# Patient Record
Sex: Female | Born: 1940
Health system: Southern US, Community
[De-identification: ages and names within clinical notes are randomized; demographics above are authoritative.]

## PROBLEM LIST (undated history)

## (undated) DIAGNOSIS — Z4689 Encounter for fitting and adjustment of other specified devices: Secondary | ICD-10-CM

## (undated) DIAGNOSIS — Z87898 Personal history of other specified conditions: Secondary | ICD-10-CM

## (undated) HISTORY — PX: APPENDECTOMY: SHX54

---

## 2011-07-23 ENCOUNTER — Other Ambulatory Visit: Payer: Self-pay | Admitting: Obstetrics and Gynecology

## 2011-09-11 ENCOUNTER — Encounter (HOSPITAL_COMMUNITY): Payer: Self-pay | Admitting: Pharmacy Technician

## 2011-09-17 ENCOUNTER — Other Ambulatory Visit (HOSPITAL_COMMUNITY): Payer: Self-pay

## 2011-09-18 ENCOUNTER — Inpatient Hospital Stay (HOSPITAL_COMMUNITY): Admission: RE | Admit: 2011-09-18 | Payer: Self-pay | Source: Ambulatory Visit

## 2011-09-24 ENCOUNTER — Inpatient Hospital Stay (HOSPITAL_COMMUNITY)
Admission: RE | Admit: 2011-09-24 | Payer: Medicare Other | Source: Ambulatory Visit | Admitting: Obstetrics and Gynecology

## 2011-09-24 ENCOUNTER — Encounter (HOSPITAL_COMMUNITY): Admission: RE | Payer: Self-pay | Source: Ambulatory Visit

## 2011-09-24 SURGERY — HYSTERECTOMY, ABDOMINAL
Anesthesia: Choice

## 2016-11-20 DIAGNOSIS — E039 Hypothyroidism, unspecified: Secondary | ICD-10-CM | POA: Diagnosis not present

## 2016-11-20 DIAGNOSIS — E559 Vitamin D deficiency, unspecified: Secondary | ICD-10-CM | POA: Diagnosis not present

## 2016-11-20 DIAGNOSIS — R799 Abnormal finding of blood chemistry, unspecified: Secondary | ICD-10-CM | POA: Diagnosis not present

## 2016-11-20 DIAGNOSIS — B379 Candidiasis, unspecified: Secondary | ICD-10-CM | POA: Diagnosis not present

## 2016-11-20 DIAGNOSIS — N951 Menopausal and female climacteric states: Secondary | ICD-10-CM | POA: Diagnosis not present

## 2016-12-05 DIAGNOSIS — Z6824 Body mass index (BMI) 24.0-24.9, adult: Secondary | ICD-10-CM | POA: Diagnosis not present

## 2016-12-05 DIAGNOSIS — N814 Uterovaginal prolapse, unspecified: Secondary | ICD-10-CM | POA: Diagnosis not present

## 2017-02-17 DIAGNOSIS — R208 Other disturbances of skin sensation: Secondary | ICD-10-CM | POA: Diagnosis not present

## 2017-02-17 DIAGNOSIS — Z85828 Personal history of other malignant neoplasm of skin: Secondary | ICD-10-CM | POA: Diagnosis not present

## 2017-02-17 DIAGNOSIS — X32XXXA Exposure to sunlight, initial encounter: Secondary | ICD-10-CM | POA: Diagnosis not present

## 2017-02-17 DIAGNOSIS — L570 Actinic keratosis: Secondary | ICD-10-CM | POA: Diagnosis not present

## 2017-02-17 DIAGNOSIS — L538 Other specified erythematous conditions: Secondary | ICD-10-CM | POA: Diagnosis not present

## 2017-02-17 DIAGNOSIS — L820 Inflamed seborrheic keratosis: Secondary | ICD-10-CM | POA: Diagnosis not present

## 2017-02-17 DIAGNOSIS — L821 Other seborrheic keratosis: Secondary | ICD-10-CM | POA: Diagnosis not present

## 2017-02-17 DIAGNOSIS — Z08 Encounter for follow-up examination after completed treatment for malignant neoplasm: Secondary | ICD-10-CM | POA: Diagnosis not present

## 2017-02-17 DIAGNOSIS — D225 Melanocytic nevi of trunk: Secondary | ICD-10-CM | POA: Diagnosis not present

## 2017-03-05 DIAGNOSIS — N814 Uterovaginal prolapse, unspecified: Secondary | ICD-10-CM | POA: Diagnosis not present

## 2017-05-09 DIAGNOSIS — H5203 Hypermetropia, bilateral: Secondary | ICD-10-CM | POA: Diagnosis not present

## 2017-05-09 DIAGNOSIS — H52223 Regular astigmatism, bilateral: Secondary | ICD-10-CM | POA: Diagnosis not present

## 2017-05-09 DIAGNOSIS — H2513 Age-related nuclear cataract, bilateral: Secondary | ICD-10-CM | POA: Diagnosis not present

## 2017-05-09 DIAGNOSIS — H524 Presbyopia: Secondary | ICD-10-CM | POA: Diagnosis not present

## 2017-07-03 DIAGNOSIS — N814 Uterovaginal prolapse, unspecified: Secondary | ICD-10-CM | POA: Diagnosis not present

## 2017-07-30 DIAGNOSIS — N814 Uterovaginal prolapse, unspecified: Secondary | ICD-10-CM | POA: Diagnosis not present

## 2017-08-29 DIAGNOSIS — R799 Abnormal finding of blood chemistry, unspecified: Secondary | ICD-10-CM | POA: Diagnosis not present

## 2017-08-29 DIAGNOSIS — E039 Hypothyroidism, unspecified: Secondary | ICD-10-CM | POA: Diagnosis not present

## 2017-08-29 DIAGNOSIS — E559 Vitamin D deficiency, unspecified: Secondary | ICD-10-CM | POA: Diagnosis not present

## 2017-12-03 DIAGNOSIS — N814 Uterovaginal prolapse, unspecified: Secondary | ICD-10-CM | POA: Diagnosis not present

## 2018-02-17 DIAGNOSIS — Z85828 Personal history of other malignant neoplasm of skin: Secondary | ICD-10-CM | POA: Diagnosis not present

## 2018-02-17 DIAGNOSIS — L820 Inflamed seborrheic keratosis: Secondary | ICD-10-CM | POA: Diagnosis not present

## 2018-02-17 DIAGNOSIS — R238 Other skin changes: Secondary | ICD-10-CM | POA: Diagnosis not present

## 2018-02-17 DIAGNOSIS — L821 Other seborrheic keratosis: Secondary | ICD-10-CM | POA: Diagnosis not present

## 2018-02-17 DIAGNOSIS — D239 Other benign neoplasm of skin, unspecified: Secondary | ICD-10-CM | POA: Diagnosis not present

## 2018-02-17 DIAGNOSIS — Z08 Encounter for follow-up examination after completed treatment for malignant neoplasm: Secondary | ICD-10-CM | POA: Diagnosis not present

## 2018-02-17 DIAGNOSIS — Z872 Personal history of diseases of the skin and subcutaneous tissue: Secondary | ICD-10-CM | POA: Diagnosis not present

## 2018-05-04 DIAGNOSIS — M25562 Pain in left knee: Secondary | ICD-10-CM | POA: Diagnosis not present

## 2018-05-04 DIAGNOSIS — M1712 Unilateral primary osteoarthritis, left knee: Secondary | ICD-10-CM | POA: Diagnosis not present

## 2018-05-07 DIAGNOSIS — M25562 Pain in left knee: Secondary | ICD-10-CM | POA: Diagnosis not present

## 2018-05-07 DIAGNOSIS — M25462 Effusion, left knee: Secondary | ICD-10-CM | POA: Diagnosis not present

## 2018-05-29 DIAGNOSIS — N814 Uterovaginal prolapse, unspecified: Secondary | ICD-10-CM | POA: Diagnosis not present

## 2018-05-29 DIAGNOSIS — N393 Stress incontinence (female) (male): Secondary | ICD-10-CM | POA: Diagnosis not present

## 2018-05-29 DIAGNOSIS — R82998 Other abnormal findings in urine: Secondary | ICD-10-CM | POA: Diagnosis not present

## 2018-06-23 DIAGNOSIS — N814 Uterovaginal prolapse, unspecified: Secondary | ICD-10-CM | POA: Diagnosis not present

## 2018-06-23 DIAGNOSIS — N952 Postmenopausal atrophic vaginitis: Secondary | ICD-10-CM | POA: Diagnosis not present

## 2018-06-23 DIAGNOSIS — N765 Ulceration of vagina: Secondary | ICD-10-CM | POA: Diagnosis not present

## 2018-07-21 DIAGNOSIS — N393 Stress incontinence (female) (male): Secondary | ICD-10-CM | POA: Diagnosis not present

## 2018-07-21 DIAGNOSIS — N8111 Cystocele, midline: Secondary | ICD-10-CM | POA: Diagnosis not present

## 2018-07-21 DIAGNOSIS — N814 Uterovaginal prolapse, unspecified: Secondary | ICD-10-CM | POA: Diagnosis not present

## 2018-07-28 DIAGNOSIS — H43313 Vitreous membranes and strands, bilateral: Secondary | ICD-10-CM | POA: Diagnosis not present

## 2018-07-28 DIAGNOSIS — H2513 Age-related nuclear cataract, bilateral: Secondary | ICD-10-CM | POA: Diagnosis not present

## 2018-07-28 DIAGNOSIS — H52223 Regular astigmatism, bilateral: Secondary | ICD-10-CM | POA: Diagnosis not present

## 2018-09-15 DIAGNOSIS — N812 Incomplete uterovaginal prolapse: Secondary | ICD-10-CM | POA: Diagnosis not present

## 2018-09-15 DIAGNOSIS — R82998 Other abnormal findings in urine: Secondary | ICD-10-CM | POA: Diagnosis not present

## 2018-09-15 DIAGNOSIS — N898 Other specified noninflammatory disorders of vagina: Secondary | ICD-10-CM | POA: Diagnosis not present

## 2018-09-15 DIAGNOSIS — Z4689 Encounter for fitting and adjustment of other specified devices: Secondary | ICD-10-CM | POA: Diagnosis not present

## 2018-09-15 DIAGNOSIS — N393 Stress incontinence (female) (male): Secondary | ICD-10-CM | POA: Diagnosis not present

## 2018-09-15 DIAGNOSIS — L929 Granulomatous disorder of the skin and subcutaneous tissue, unspecified: Secondary | ICD-10-CM | POA: Diagnosis not present

## 2018-10-20 DIAGNOSIS — Z4689 Encounter for fitting and adjustment of other specified devices: Secondary | ICD-10-CM | POA: Diagnosis not present

## 2018-10-20 DIAGNOSIS — R82998 Other abnormal findings in urine: Secondary | ICD-10-CM | POA: Diagnosis not present

## 2018-10-20 DIAGNOSIS — N393 Stress incontinence (female) (male): Secondary | ICD-10-CM | POA: Diagnosis not present

## 2018-10-20 DIAGNOSIS — N812 Incomplete uterovaginal prolapse: Secondary | ICD-10-CM | POA: Diagnosis not present

## 2018-10-26 DIAGNOSIS — R82998 Other abnormal findings in urine: Secondary | ICD-10-CM | POA: Diagnosis not present

## 2018-10-26 DIAGNOSIS — R399 Unspecified symptoms and signs involving the genitourinary system: Secondary | ICD-10-CM | POA: Diagnosis not present

## 2018-10-26 DIAGNOSIS — B9620 Unspecified Escherichia coli [E. coli] as the cause of diseases classified elsewhere: Secondary | ICD-10-CM | POA: Diagnosis not present

## 2018-11-10 ENCOUNTER — Ambulatory Visit (INDEPENDENT_AMBULATORY_CARE_PROVIDER_SITE_OTHER): Payer: Medicare Other | Admitting: Sports Medicine

## 2018-11-10 ENCOUNTER — Ambulatory Visit
Admission: RE | Admit: 2018-11-10 | Discharge: 2018-11-10 | Disposition: A | Discharge status: Home / Self Care | Payer: Medicare Other | Source: Ambulatory Visit | Attending: Sports Medicine | Admitting: Sports Medicine

## 2018-11-10 VITALS — BP 120/58 | Temp 98.4°F | Ht 62.5 in | Wt 130.0 lb

## 2018-11-10 DIAGNOSIS — M25562 Pain in left knee: Secondary | ICD-10-CM

## 2018-11-10 DIAGNOSIS — R399 Unspecified symptoms and signs involving the genitourinary system: Secondary | ICD-10-CM | POA: Diagnosis not present

## 2018-11-10 DIAGNOSIS — M1712 Unilateral primary osteoarthritis, left knee: Secondary | ICD-10-CM | POA: Diagnosis not present

## 2018-11-10 NOTE — Progress Notes (Signed)
HPI  CC: Left knee pain  Latoya Reynolds is a 77 year old female presents for left knee pain.  She states the pain started in June of this year.  She states [place] and [place] her [place].  She states she collapsed at that time.  She went to an urgent care in [place], where she had x-rays obtained.  She was told she had a spur in her left knee.  She has not followed up since that time.  She states the pain is gotten progressively better.  She states the pain returned around 1 week ago.  He does not remember an inciting event at that time.  She states the pain is sharp in nature located over the patella.  He states the pain is worse at nighttime.  States is also made worse with prolonged walking.  She has been taking ibuprofen 40 mg twice a day with some relief.  The pain does not radiate any other parts of her body.  She denies any numbness and tingling or leg.  She denies giving out of her leg.  She denies any locking or catching of the knee.  She denies any limp.  She states she did have a fever of 101 F on Saturday this week.  She has no associated symptoms.  She was told she had a urinary tract affection 7 days ago for which she completed a 5-day course of antibiotics.  She is currently asymptomatic otherwise.  Past Injuries: None in the area Past Surgeries: No prior knee surgeries Smoking: Denies Family Hx: Noncontributory  All past medical history, medications, allergies reviewed myself at today's visit.  ROS: Per HPI; in addition no fever, no rash, no additional weakness, no additional numbness, no additional paresthesias, and no additional falls/injury.   Objective: BP (!) 120/58   Temp 98.4 F (36.9 C) (Oral)   Ht 5' 2.5" (1.588 m)   Wt 130 lb (59 kg)   BMI 23.40 kg/m  Gen: Right-Hand Dominant. NAD, well groomed, a/o x3, normal affect.  CV: Well-perfused. Warm.  Resp: Non-labored.  Neuro: Sensation intact throughout. No gross coordination deficits.  Gait:  Nonpathologic posture, unremarkable stride without signs of limp or balance issues.  Left knee exam: Varus alignment of the knees.  No erythema or warmth.  Mild swelling of the knee.  Tenderness palpation on the lateral and medial aspect the patella.  Tenderness palpation over the medial joint line.  Full range of motion knee extension knee flexion.  Strength out of 5 throughout testing.  Negative Lockman, negative posterior drawer, negative valgus stress testing, negative varus stress testing, negative McMurray test.  Assessment and Plan: Left-sided knee pain, likely osteoarthritis in nature.  We discussed treatment options with Latoya Reynolds today's visit.  We will obtain x-rays this time to further evaluate the joint space.  I suspect there will be an element of patellofemoral arthritis, as well as likely medial and lateral involvement as well.  We did offer her steroid injection in the knee today.  She deferred at this time.  We offered her meloxicam as well, which she will stick to Tylenol at this time.  If she returns in 2 weeks and continues to have pain in her knee, I will consider starting meloxicam or doing a steroid injection.  If she begins to develop mechanical symptoms, an MRI may be warranted down the road.  Latoya Rife, MD [facility] Fellow 11/10/2018 1:24 PM  Patient seen and evaluated with the  sports medicine fellow.  I agree with the above plan of care.  Patient's daughter is concerned about septic joint.  I have reassured her that there are no physical exam findings to suggest that.  Patient has a trace effusion on exam and on ultrasound but certainly nothing large enough to aspirate.  I did offer cortisone injection but they would like to wait on that for now.  I was able to review her x-rays and she does have moderate medial compartmental narrowing on the 30 degree flexion view.  If patient would like to reconsider cortisone injection or a brief course of oral  anti-inflammatories, she will let me know.  Otherwise, follow-up as needed.

## 2018-12-01 DIAGNOSIS — N765 Ulceration of vagina: Secondary | ICD-10-CM | POA: Diagnosis not present

## 2018-12-01 DIAGNOSIS — N814 Uterovaginal prolapse, unspecified: Secondary | ICD-10-CM | POA: Diagnosis not present

## 2018-12-01 DIAGNOSIS — R399 Unspecified symptoms and signs involving the genitourinary system: Secondary | ICD-10-CM | POA: Diagnosis not present

## 2019-07-08 DIAGNOSIS — N813 Complete uterovaginal prolapse: Secondary | ICD-10-CM | POA: Diagnosis not present

## 2019-07-08 DIAGNOSIS — Z960 Presence of urogenital implants: Secondary | ICD-10-CM | POA: Diagnosis not present

## 2020-01-11 DIAGNOSIS — Z4689 Encounter for fitting and adjustment of other specified devices: Secondary | ICD-10-CM | POA: Diagnosis not present

## 2020-01-11 DIAGNOSIS — N814 Uterovaginal prolapse, unspecified: Secondary | ICD-10-CM | POA: Diagnosis not present

## 2020-01-11 DIAGNOSIS — L929 Granulomatous disorder of the skin and subcutaneous tissue, unspecified: Secondary | ICD-10-CM | POA: Diagnosis not present

## 2020-01-11 DIAGNOSIS — N765 Ulceration of vagina: Secondary | ICD-10-CM | POA: Diagnosis not present

## 2020-01-20 IMAGING — CR DG KNEE COMPLETE 4+V*L*
3 series · 3 of 3 positions shown · non-contrast
Comparison: None.

CLINICAL DATA: Knee pain

EXAM:
LEFT KNEE - COMPLETE 4+ VIEW

[w knee ap left (1 of 2)]
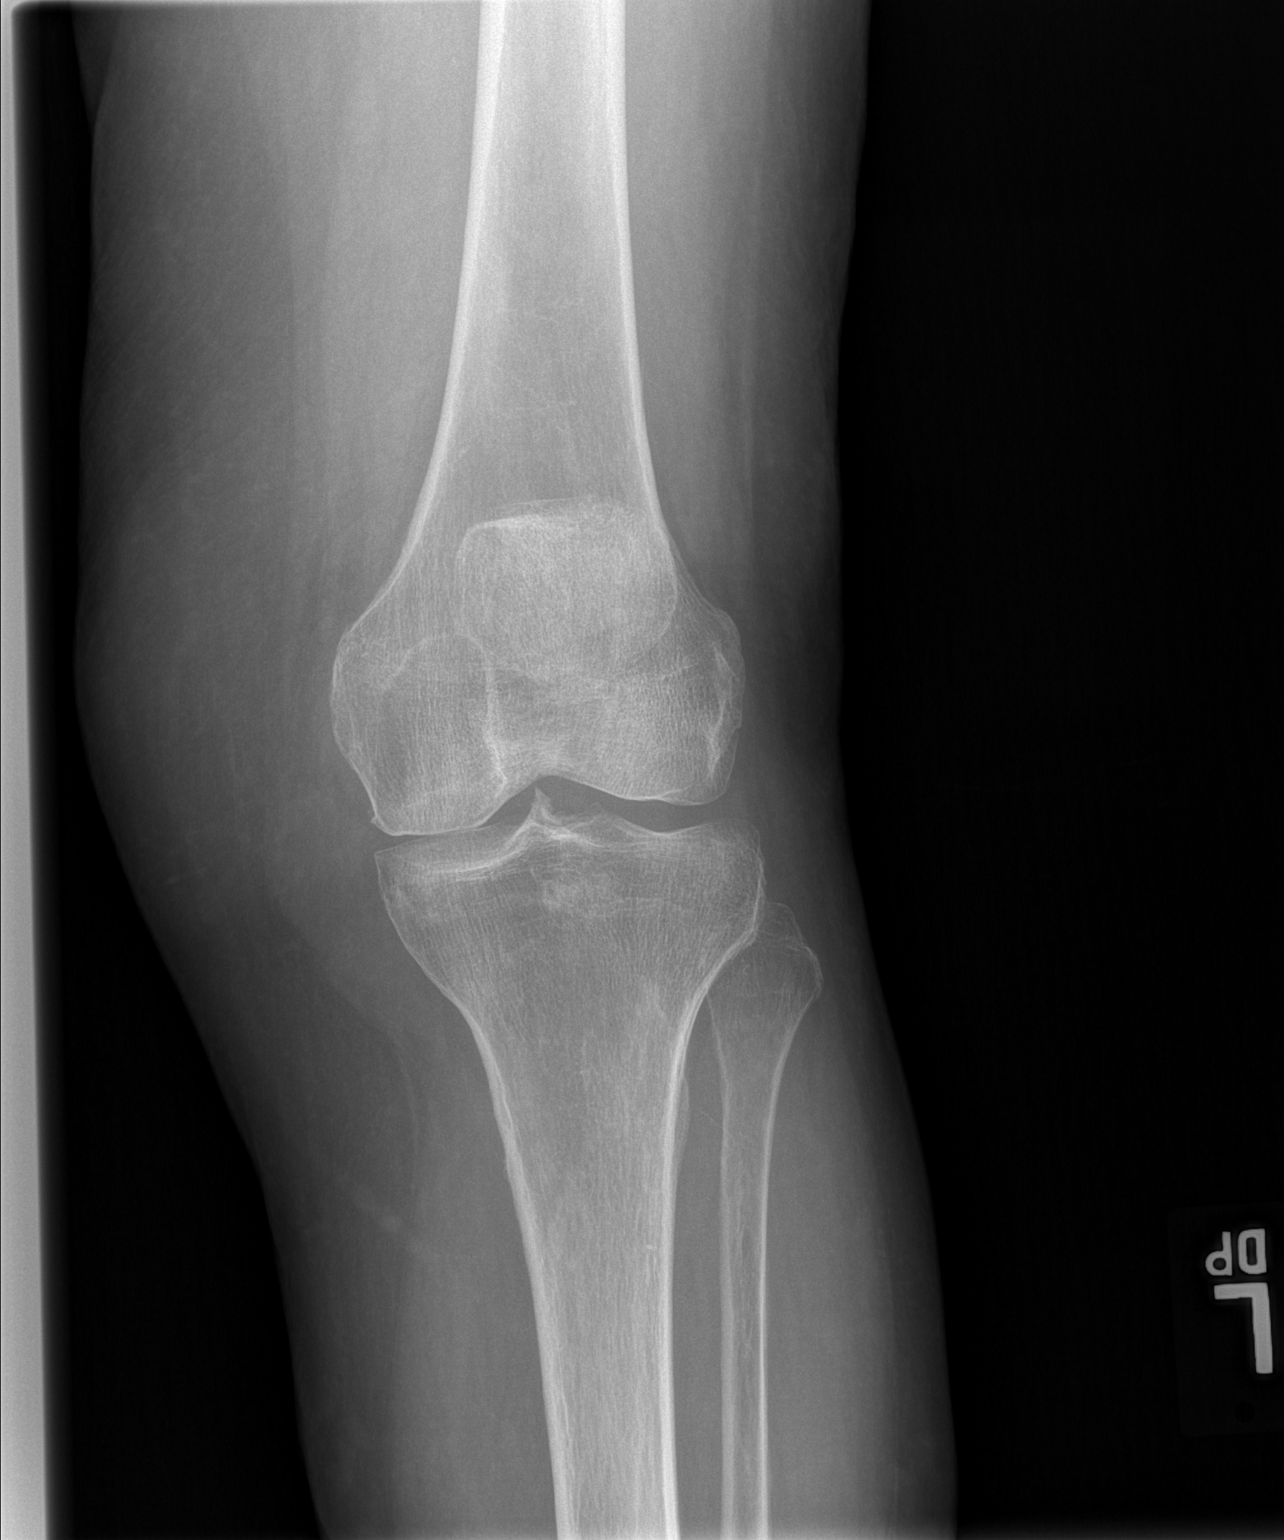

[w knee lat. left]
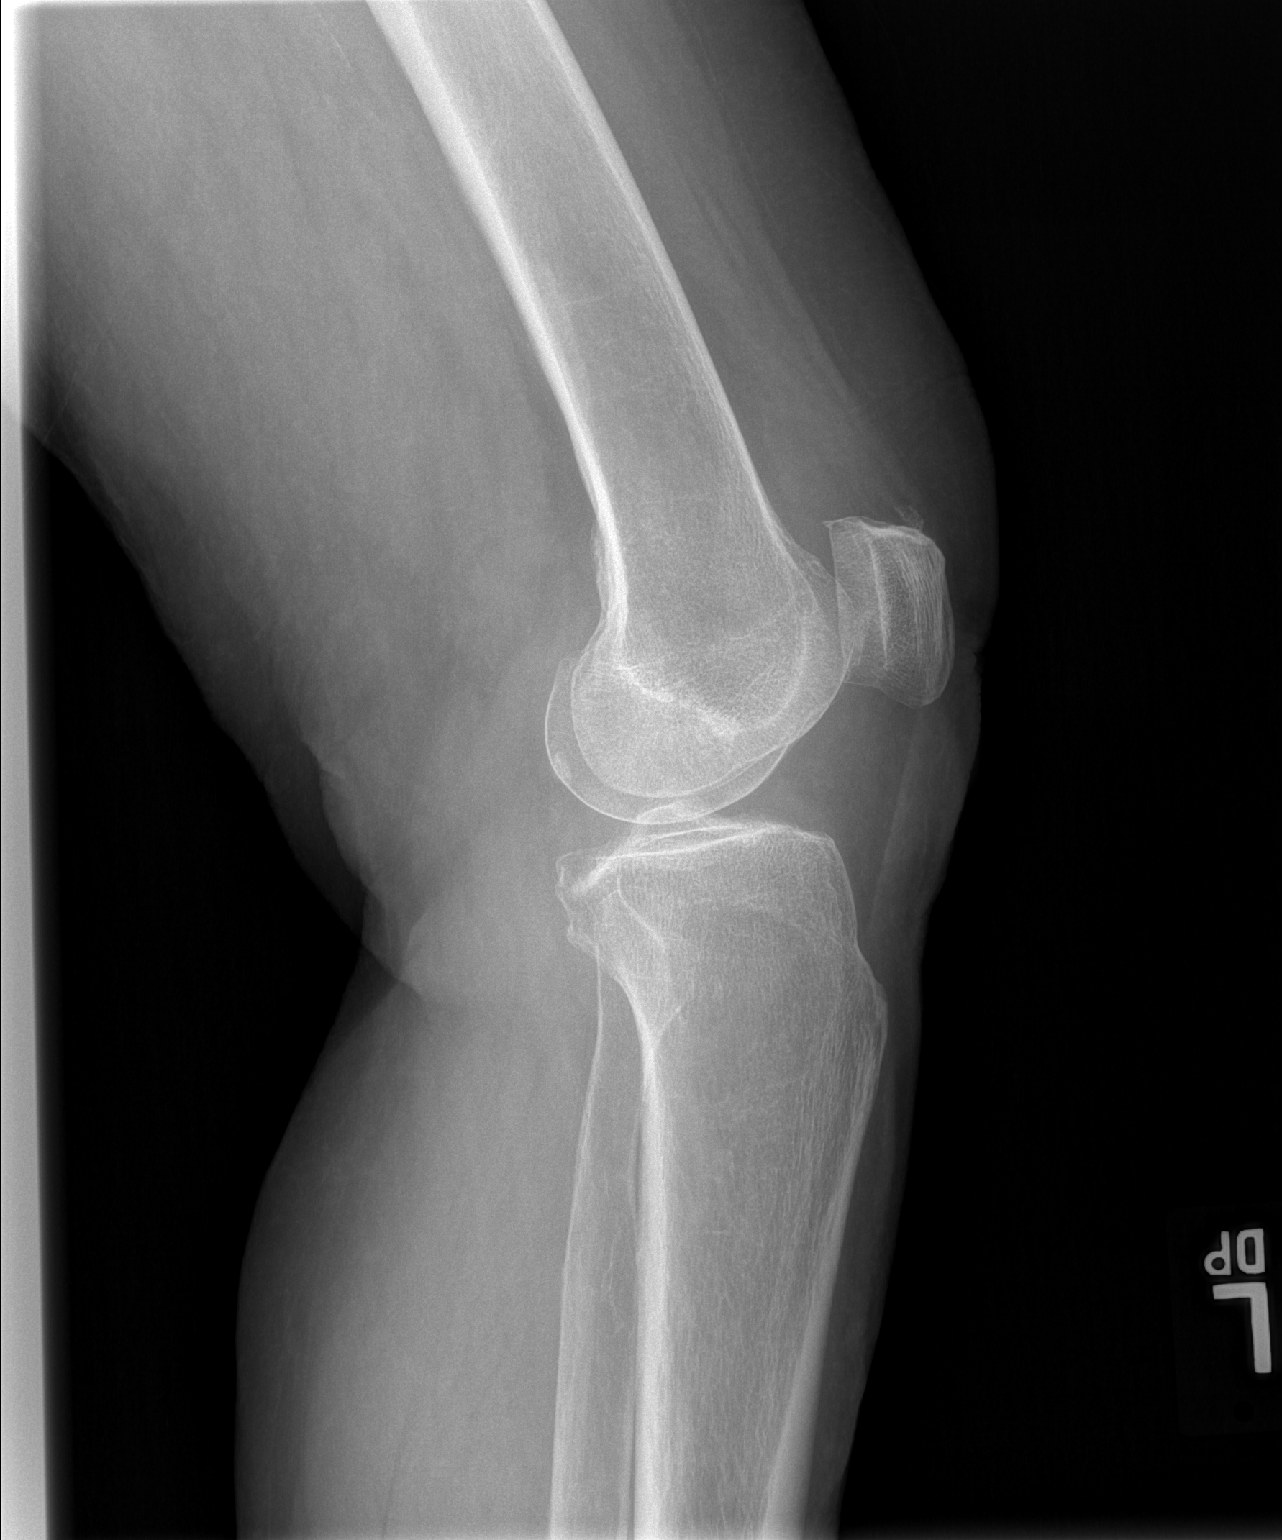

[w knee ap left (2 of 2)]
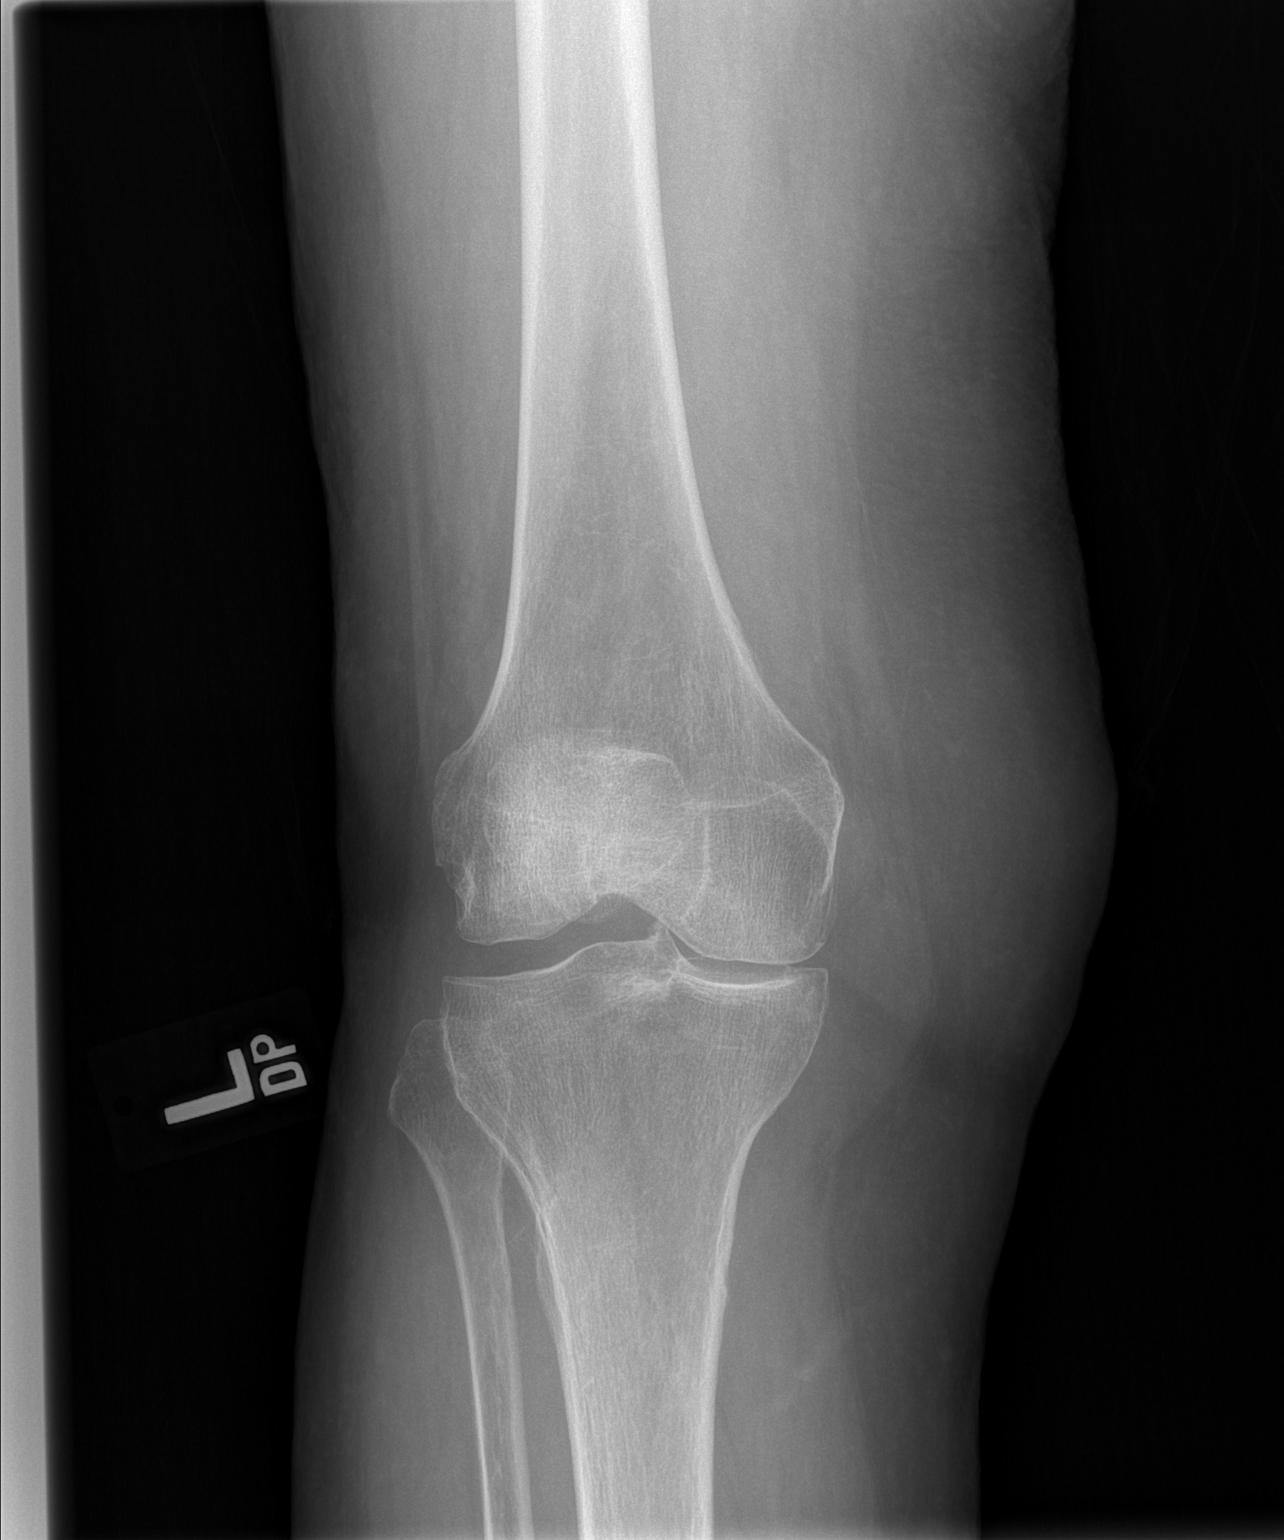

[3 of 3 positions shown; findings below may reference images not displayed]

FINDINGS: No fracture or malalignment. Mild degenerative change involving the
medial and patellofemoral compartments. Trace knee effusion.
IMPRESSION: 1. No acute osseous abnormality
2. Mild arthritis with trace knee effusion.

## 2020-03-24 DIAGNOSIS — N813 Complete uterovaginal prolapse: Secondary | ICD-10-CM | POA: Diagnosis not present

## 2020-03-24 DIAGNOSIS — N814 Uterovaginal prolapse, unspecified: Secondary | ICD-10-CM | POA: Diagnosis not present

## 2020-03-24 DIAGNOSIS — R82998 Other abnormal findings in urine: Secondary | ICD-10-CM | POA: Diagnosis not present

## 2020-03-24 DIAGNOSIS — N393 Stress incontinence (female) (male): Secondary | ICD-10-CM | POA: Diagnosis not present

## 2020-03-24 DIAGNOSIS — N898 Other specified noninflammatory disorders of vagina: Secondary | ICD-10-CM | POA: Diagnosis not present

## 2020-04-19 DIAGNOSIS — N765 Ulceration of vagina: Secondary | ICD-10-CM | POA: Diagnosis not present

## 2020-04-19 DIAGNOSIS — N812 Incomplete uterovaginal prolapse: Secondary | ICD-10-CM | POA: Diagnosis not present

## 2020-06-24 DIAGNOSIS — M9901 Segmental and somatic dysfunction of cervical region: Secondary | ICD-10-CM | POA: Diagnosis not present

## 2020-06-24 DIAGNOSIS — M5030 Other cervical disc degeneration, unspecified cervical region: Secondary | ICD-10-CM | POA: Diagnosis not present

## 2020-06-29 DIAGNOSIS — M1711 Unilateral primary osteoarthritis, right knee: Secondary | ICD-10-CM | POA: Diagnosis not present

## 2020-06-29 DIAGNOSIS — M25561 Pain in right knee: Secondary | ICD-10-CM | POA: Diagnosis not present

## 2020-07-13 DIAGNOSIS — M9901 Segmental and somatic dysfunction of cervical region: Secondary | ICD-10-CM | POA: Diagnosis not present

## 2020-07-13 DIAGNOSIS — M5030 Other cervical disc degeneration, unspecified cervical region: Secondary | ICD-10-CM | POA: Diagnosis not present

## 2020-08-10 DIAGNOSIS — M9901 Segmental and somatic dysfunction of cervical region: Secondary | ICD-10-CM | POA: Diagnosis not present

## 2020-08-10 DIAGNOSIS — M5030 Other cervical disc degeneration, unspecified cervical region: Secondary | ICD-10-CM | POA: Diagnosis not present

## 2020-08-22 DIAGNOSIS — M9901 Segmental and somatic dysfunction of cervical region: Secondary | ICD-10-CM | POA: Diagnosis not present

## 2020-08-22 DIAGNOSIS — M5030 Other cervical disc degeneration, unspecified cervical region: Secondary | ICD-10-CM | POA: Diagnosis not present

## 2020-09-26 DIAGNOSIS — M5030 Other cervical disc degeneration, unspecified cervical region: Secondary | ICD-10-CM | POA: Diagnosis not present

## 2020-09-26 DIAGNOSIS — M9901 Segmental and somatic dysfunction of cervical region: Secondary | ICD-10-CM | POA: Diagnosis not present

## 2020-10-10 ENCOUNTER — Emergency Department (HOSPITAL_BASED_OUTPATIENT_CLINIC_OR_DEPARTMENT_OTHER)
Admission: EM | Admit: 2020-10-10 | Discharge: 2020-10-10 | Disposition: A | Discharge status: Home / Self Care | Payer: Medicare Other | Attending: Emergency Medicine | Admitting: Emergency Medicine

## 2020-10-10 ENCOUNTER — Encounter (HOSPITAL_BASED_OUTPATIENT_CLINIC_OR_DEPARTMENT_OTHER): Payer: Self-pay | Admitting: Emergency Medicine

## 2020-10-10 ENCOUNTER — Other Ambulatory Visit: Payer: Self-pay

## 2020-10-10 DIAGNOSIS — R82998 Other abnormal findings in urine: Secondary | ICD-10-CM | POA: Diagnosis not present

## 2020-10-10 DIAGNOSIS — R399 Unspecified symptoms and signs involving the genitourinary system: Secondary | ICD-10-CM | POA: Diagnosis not present

## 2020-10-10 DIAGNOSIS — B9561 Methicillin susceptible Staphylococcus aureus infection as the cause of diseases classified elsewhere: Secondary | ICD-10-CM | POA: Diagnosis not present

## 2020-10-10 DIAGNOSIS — R300 Dysuria: Secondary | ICD-10-CM | POA: Diagnosis present

## 2020-10-10 DIAGNOSIS — N390 Urinary tract infection, site not specified: Secondary | ICD-10-CM | POA: Insufficient documentation

## 2020-10-10 DIAGNOSIS — B9689 Other specified bacterial agents as the cause of diseases classified elsewhere: Secondary | ICD-10-CM | POA: Diagnosis not present

## 2020-10-10 HISTORY — DX: Encounter for fitting and adjustment of other specified devices: Z46.89

## 2020-10-10 HISTORY — DX: Personal history of other specified conditions: Z87.898

## 2020-10-10 LAB — CBC WITH DIFFERENTIAL/PLATELET
Abs Immature Granulocytes: 0.01 10*3/uL (ref 0.00–0.07)
Basophils Absolute: 0 10*3/uL (ref 0.0–0.1)
Basophils Relative: 0 %
Eosinophils Absolute: 0 10*3/uL (ref 0.0–0.5)
Eosinophils Relative: 0 %
HCT: 36.7 % (ref 36.0–46.0)
Hemoglobin: 12.4 g/dL (ref 12.0–15.0)
Immature Granulocytes: 0 %
Lymphocytes Relative: 28 %
Lymphs Abs: 1.4 10*3/uL (ref 0.7–4.0)
MCH: 31.2 pg (ref 26.0–34.0)
MCHC: 33.8 g/dL (ref 30.0–36.0)
MCV: 92.4 fL (ref 80.0–100.0)
Monocytes Absolute: 0.3 10*3/uL (ref 0.1–1.0)
Monocytes Relative: 6 %
Neutro Abs: 3.2 10*3/uL (ref 1.7–7.7)
Neutrophils Relative %: 66 %
Platelets: 150 10*3/uL (ref 150–400)
RBC: 3.97 MIL/uL (ref 3.87–5.11)
RDW: 12.7 % (ref 11.5–15.5)
WBC: 4.8 10*3/uL (ref 4.0–10.5)
nRBC: 0 % (ref 0.0–0.2)

## 2020-10-10 LAB — URINALYSIS, MICROSCOPIC (REFLEX)

## 2020-10-10 LAB — URINALYSIS, ROUTINE W REFLEX MICROSCOPIC
Bilirubin Urine: NEGATIVE
Glucose, UA: NEGATIVE mg/dL
Ketones, ur: 15 mg/dL — AB
Nitrite: NEGATIVE
Protein, ur: NEGATIVE mg/dL
Specific Gravity, Urine: 1.015 (ref 1.005–1.030)
pH: 6 (ref 5.0–8.0)

## 2020-10-10 LAB — BASIC METABOLIC PANEL
Anion gap: 11 (ref 5–15)
BUN: 17 mg/dL (ref 8–23)
CO2: 22 mmol/L (ref 22–32)
Calcium: 8.4 mg/dL — ABNORMAL LOW (ref 8.9–10.3)
Chloride: 98 mmol/L (ref 98–111)
Creatinine, Ser: 0.65 mg/dL (ref 0.44–1.00)
GFR, Estimated: >60 mL/min (ref >60)
Glucose, Bld: 103 mg/dL — ABNORMAL HIGH (ref 70–99)
Potassium: 3.2 mmol/L — ABNORMAL LOW (ref 3.5–5.1)
Sodium: 131 mmol/L — ABNORMAL LOW (ref 135–145)

## 2020-10-10 MED ORDER — CEPHALEXIN 500 MG PO CAPS: 500.0000 mg | ORAL_CAPSULE | Freq: Two times a day (BID) | ORAL | 0 refills | Status: AC

## 2020-10-10 MED ORDER — CEPHALEXIN 250 MG PO CAPS
500.0000 mg | ORAL_CAPSULE | Freq: Once | ORAL | Status: AC
  Administered 2020-10-10: 500 mg via ORAL
  Filled 2020-10-10: qty 2

## 2020-10-10 NOTE — ED Triage Notes (Signed)
Pt having incontinence, some fever, some dysuria.

## 2020-10-10 NOTE — ED Provider Notes (Signed)
[facility]PART[facility] Provider Note   CSN: [id number] Arrival date & time: 10/10/20  1840     History Chief Complaint  Patient presents with  . Dysuria    Latoya Reynolds is a 79 y.o. female.  The history is provided by the patient.  Dysuria Pain quality:  Burning Pain severity:  Mild Onset quality:  Gradual Timing:  Constant Progression:  Unchanged Chronicity:  New Relieved by:  Nothing Ineffective treatments:  None tried Associated symptoms: fever   Associated symptoms: no abdominal pain, no flank pain, no genital lesions, no nausea, no vaginal discharge and no vomiting        Past Medical History:  Diagnosis Date  . History of palpitations   . Pessary maintenance     There are no problems to display for this patient.     OB History   No obstetric history on file.     No family history on file.  Social History   Tobacco Use  . Smoking status: Never Smoker  . Smokeless tobacco: Never Used  Substance Use Topics  . Alcohol use: Not on file  . Drug use: Not on file    Home Medications Prior to Admission medications   Medication Sig Start Date End Date Taking? Authorizing Provider  cephALEXin (KEFLEX) 500 MG capsule Take 1 capsule (500 mg total) by mouth 2 (two) times daily for 10 days. 10/10/20 10/20/20  Lennice Sites, DO    Allergies    Patient has no known allergies.  Review of Systems   Review of Systems  Constitutional: Positive for fever. Negative for chills.  HENT: Negative for ear pain and sore throat.   Eyes: Negative for pain and visual disturbance.  Respiratory: Negative for cough and shortness of breath.   Cardiovascular: Negative for chest pain and palpitations.  Gastrointestinal: Negative for abdominal pain, nausea and vomiting.  Genitourinary: Positive for dysuria. Negative for flank pain, hematuria and vaginal discharge.  Musculoskeletal: Negative for arthralgias and back pain.  Skin: Negative for color  change and rash.  Neurological: Negative for seizures and syncope.  All other systems reviewed and are negative.   Physical Exam Updated Vital Signs BP 103/66   Pulse 88   Temp 100 F (37.8 C) (Oral)   Resp 17   Ht 5\' 2"  (1.575 m)   Wt 54.4 kg   SpO2 100%   BMI 21.95 kg/m   Physical Exam Vitals and nursing note reviewed.  Constitutional:      General: She is not in acute distress.    Appearance: She is well-developed.  HENT:     Head: Normocephalic and atraumatic.     Nose: Nose normal.     Mouth/Throat:     Mouth: Mucous membranes are moist.  Eyes:     Extraocular Movements: Extraocular movements intact.     Conjunctiva/sclera: Conjunctivae normal.     Pupils: Pupils are equal, round, and reactive to light.  Cardiovascular:     Rate and Rhythm: Normal rate and regular rhythm.     Pulses: Normal pulses.     Heart sounds: Normal heart sounds. No murmur heard.   Pulmonary:     Effort: Pulmonary effort is normal. No respiratory distress.     Breath sounds: Normal breath sounds.  Abdominal:     Palpations: Abdomen is soft.     Tenderness: There is no abdominal tenderness.  Musculoskeletal:     Cervical back: Neck supple.  Skin:    General: Skin is  warm and dry.     Capillary Refill: Capillary refill takes less than 2 seconds.  Neurological:     General: No focal deficit present.     Mental Status: She is alert.  Psychiatric:        Mood and Affect: Mood normal.     ED Results / Procedures / Treatments   Labs (all labs ordered are listed, but only abnormal results are displayed) Labs Reviewed  URINALYSIS, ROUTINE W REFLEX MICROSCOPIC - Abnormal; Notable for the following components:      Result Value   APPearance CLOUDY (*)    Hgb urine dipstick TRACE (*)    Ketones, ur 15 (*)    Leukocytes,Ua MODERATE (*)    All other components within normal limits  BASIC METABOLIC PANEL - Abnormal; Notable for the following components:   Sodium 131 (*)    Potassium  3.2 (*)    Glucose, Bld 103 (*)    Calcium 8.4 (*)    All other components within normal limits  URINALYSIS, MICROSCOPIC (REFLEX) - Abnormal; Notable for the following components:   Bacteria, UA MANY (*)    All other components within normal limits  URINE CULTURE  CBC WITH DIFFERENTIAL/PLATELET    EKG None  Radiology No results found.  Procedures Procedures (including critical care time)  Medications Ordered in ED Medications  cephALEXin (KEFLEX) capsule 500 mg (has no administration in time range)    ED Course  I have reviewed the triage vital signs and the nursing notes.  Pertinent labs & imaging results that were available during my care of the patient were reviewed by me and considered in my medical decision making (see chart for details).    MDM Rules/Calculators/A&P                          Latoya Reynolds is a 79 year old female with no significant medical history presents the ED with dysuria.  Normal vitals.  Temperature 100.  Has had some low-grade temperatures at home as well.  Feels like she has a UTI.  No abdominal pain.  No nausea, no vomiting.  Urinalysis consistent with infection.  No significant leukocytosis.  Vital signs overall reassuring.  No concern for sepsis.  Patient overall appears well and in no discomfort.  Will prescribe Keflex and discharged from ED in good condition.  She understands return precautions.  This chart was dictated using voice recognition software.  Despite best efforts to proofread,  errors can occur which can change the documentation meaning.    Final Clinical Impression(s) / ED Diagnoses Final diagnoses:  Lower urinary tract infectious disease    Rx / DC Orders ED Discharge Orders         Ordered    cephALEXin (KEFLEX) 500 MG capsule  2 times daily        10/10/20 2040           Lennice Sites, DO 10/10/20 2040

## 2020-10-10 NOTE — ED Notes (Signed)
Pt placed in a gown and hooked up to the monitor with the BP cuff and pulse ox 

## 2020-10-12 LAB — URINE CULTURE

## 2020-10-13 DIAGNOSIS — Z4689 Encounter for fitting and adjustment of other specified devices: Secondary | ICD-10-CM | POA: Diagnosis not present

## 2020-10-13 DIAGNOSIS — N393 Stress incontinence (female) (male): Secondary | ICD-10-CM | POA: Diagnosis not present

## 2020-10-13 DIAGNOSIS — N813 Complete uterovaginal prolapse: Secondary | ICD-10-CM | POA: Diagnosis not present

## 2020-10-15 DIAGNOSIS — U071 COVID-19: Secondary | ICD-10-CM | POA: Diagnosis not present

## 2020-10-15 DIAGNOSIS — R0602 Shortness of breath: Secondary | ICD-10-CM | POA: Diagnosis not present

## 2020-10-15 DIAGNOSIS — R109 Unspecified abdominal pain: Secondary | ICD-10-CM | POA: Diagnosis not present

## 2020-10-15 DIAGNOSIS — K5730 Diverticulosis of large intestine without perforation or abscess without bleeding: Secondary | ICD-10-CM | POA: Diagnosis not present

## 2020-10-15 DIAGNOSIS — R5383 Other fatigue: Secondary | ICD-10-CM | POA: Diagnosis not present

## 2020-10-15 DIAGNOSIS — Z79899 Other long term (current) drug therapy: Secondary | ICD-10-CM | POA: Diagnosis not present

## 2020-10-15 DIAGNOSIS — J984 Other disorders of lung: Secondary | ICD-10-CM | POA: Diagnosis not present

## 2020-10-16 DIAGNOSIS — R0602 Shortness of breath: Secondary | ICD-10-CM | POA: Diagnosis not present

## 2020-10-16 DIAGNOSIS — R109 Unspecified abdominal pain: Secondary | ICD-10-CM | POA: Diagnosis not present

## 2020-10-16 DIAGNOSIS — K5730 Diverticulosis of large intestine without perforation or abscess without bleeding: Secondary | ICD-10-CM | POA: Diagnosis not present

## 2020-10-16 DIAGNOSIS — J984 Other disorders of lung: Secondary | ICD-10-CM | POA: Diagnosis not present
